# Patient Record
Sex: Female | Born: 1980 | Hispanic: Yes | Marital: Single | Smoking: Never smoker
Health system: Southern US, Community
[De-identification: ages and names within clinical notes are randomized; demographics above are authoritative.]

## PROBLEM LIST (undated history)

## (undated) DIAGNOSIS — E7800 Pure hypercholesterolemia, unspecified: Secondary | ICD-10-CM

## (undated) HISTORY — PX: ABDOMINAL HYSTERECTOMY: SHX81

---

## 2013-01-13 ENCOUNTER — Emergency Department: Payer: Self-pay | Admitting: Emergency Medicine

## 2017-03-24 ENCOUNTER — Encounter: Payer: Self-pay | Admitting: Emergency Medicine

## 2017-03-24 ENCOUNTER — Emergency Department: Payer: Self-pay

## 2017-03-24 ENCOUNTER — Emergency Department
Admission: EM | Admit: 2017-03-24 | Discharge: 2017-03-24 | Disposition: A | Discharge status: Home / Self Care | Payer: Self-pay | Attending: Emergency Medicine | Admitting: Emergency Medicine

## 2017-03-24 DIAGNOSIS — R1031 Right lower quadrant pain: Secondary | ICD-10-CM | POA: Insufficient documentation

## 2017-03-24 DIAGNOSIS — R1011 Right upper quadrant pain: Secondary | ICD-10-CM | POA: Insufficient documentation

## 2017-03-24 DIAGNOSIS — E785 Hyperlipidemia, unspecified: Secondary | ICD-10-CM | POA: Insufficient documentation

## 2017-03-24 DIAGNOSIS — R102 Pelvic and perineal pain: Secondary | ICD-10-CM | POA: Insufficient documentation

## 2017-03-24 DIAGNOSIS — M5441 Lumbago with sciatica, right side: Secondary | ICD-10-CM | POA: Insufficient documentation

## 2017-03-24 HISTORY — DX: Pure hypercholesterolemia, unspecified: E78.00

## 2017-03-24 LAB — CBC WITH DIFFERENTIAL/PLATELET
BASOS ABS: 0.1 10*3/uL (ref 0–0.1)
BASOS PCT: 1 %
EOS ABS: 0.1 10*3/uL (ref 0–0.7)
Eosinophils Relative: 1 %
HEMATOCRIT: 42.4 % (ref 35.0–47.0)
HEMOGLOBIN: 14.2 g/dL (ref 12.0–16.0)
Lymphocytes Relative: 31 %
Lymphs Abs: 2.9 10*3/uL (ref 1.0–3.6)
MCH: 28.2 pg (ref 26.0–34.0)
MCHC: 33.6 g/dL (ref 32.0–36.0)
MCV: 84.1 fL (ref 80.0–100.0)
MONOS PCT: 5 %
Monocytes Absolute: 0.5 10*3/uL (ref 0.2–0.9)
NEUTROS ABS: 5.7 10*3/uL (ref 1.4–6.5)
NEUTROS PCT: 62 %
Platelets: 372 10*3/uL (ref 150–440)
RBC: 5.04 MIL/uL (ref 3.80–5.20)
RDW: 14.6 % — ABNORMAL HIGH (ref 11.5–14.5)
WBC: 9.2 10*3/uL (ref 3.6–11.0)

## 2017-03-24 LAB — COMPREHENSIVE METABOLIC PANEL
ALBUMIN: 4.5 g/dL (ref 3.5–5.0)
ALK PHOS: 90 U/L (ref 38–126)
ALT: 19 U/L (ref 14–54)
ANION GAP: 8 (ref 5–15)
AST: 19 U/L (ref 15–41)
BILIRUBIN TOTAL: 0.6 mg/dL (ref 0.3–1.2)
BUN: 13 mg/dL (ref 6–20)
CALCIUM: 9.9 mg/dL (ref 8.9–10.3)
CO2: 25 mmol/L (ref 22–32)
CREATININE: 0.57 mg/dL (ref 0.44–1.00)
Chloride: 104 mmol/L (ref 101–111)
GFR calc Af Amer: >60 mL/min (ref >60)
GFR calc non Af Amer: >60 mL/min (ref >60)
GLUCOSE: 121 mg/dL — AB (ref 65–99)
Potassium: 3.4 mmol/L — ABNORMAL LOW (ref 3.5–5.1)
SODIUM: 137 mmol/L (ref 135–145)
TOTAL PROTEIN: 8.5 g/dL — AB (ref 6.5–8.1)

## 2017-03-24 LAB — LIPASE, BLOOD: Lipase: 24 U/L (ref 11–51)

## 2017-03-24 LAB — POCT PREGNANCY, URINE: Preg Test, Ur: NEGATIVE

## 2017-03-24 MED ORDER — METHYLPREDNISOLONE 4 MG PO TBPK: ORAL_TABLET | ORAL | 0 refills | Status: AC

## 2017-03-24 MED ORDER — MORPHINE SULFATE (PF) 4 MG/ML IV SOLN
4.0000 mg | Freq: Once | INTRAVENOUS | Status: AC
  Administered 2017-03-24: 4 mg via INTRAVENOUS
  Filled 2017-03-24: qty 1

## 2017-03-24 MED ORDER — ONDANSETRON HCL 4 MG/2ML IJ SOLN
4.0000 mg | Freq: Once | INTRAMUSCULAR | Status: AC
  Administered 2017-03-24: 4 mg via INTRAVENOUS
  Filled 2017-03-24: qty 2

## 2017-03-24 MED ORDER — CYCLOBENZAPRINE HCL 10 MG PO TABS: 10.0000 mg | ORAL_TABLET | Freq: Three times a day (TID) | ORAL | 0 refills | Status: AC | PRN

## 2017-03-24 MED ORDER — TRAMADOL HCL 50 MG PO TABS: 50.0000 mg | ORAL_TABLET | Freq: Four times a day (QID) | ORAL | 0 refills | Status: AC | PRN

## 2017-03-24 NOTE — ED Triage Notes (Signed)
Pt to ed with c/o all over generalized abd pain intermittently over the last month.  Pt also reports right sided lower back pain that radiates into right leg and right hip x3 days.  Pt reports pain in abd is worse after eating and feels like her abd is "going to blow up". Pt denies vomiting, denies diarrhea. Pt skin warm and dry.  Denies use of OTC meds.

## 2017-03-24 NOTE — ED Provider Notes (Signed)
[facility] Emergency Department Provider Note   ____________________________________________   First MD Initiated Contact with Patient 03/24/17 1126     (approximate)  I have reviewed the triage vital signs and the nursing notes.   HISTORY  Chief Complaint Back Pain and Abdominal Pain  ENT  HPI Chelsea Nichols is a 36 y.o. female patient presents with 2 complaints. First complaint is generalized abdominal pain for the past month. Patient states she's discussed this with her PCP he did a limited exam but no images or lab tests. Patient stated her abdominal pain is worse after eating. She states she felt like her abdomen is going to "blow up". Patient also complaining of right lower abdominal/pelvic pain. Patient denies any vomiting or diarrhea but state nausea patient also complaining of back pain with radicular component to the right lower extremity. Patient denies any provocative incident for her pain. Patient denies any bladder or bowel dysfunction. Patient states she also  discussed back pain to her PCP was told to follow-up condition worsens. No palliative measures for both complaints. Patient rates the pain as a 7/10. Patient is currently on her menstrual cycle.   Past Medical History:  Diagnosis Date  . High cholesterol     There are no active problems to display for this patient.   History reviewed. No pertinent surgical history.  Prior to Admission medications   Medication Sig Start Date End Date Taking? Authorizing Provider  cyclobenzaprine (FLEXERIL) 10 MG tablet Take 1 tablet (10 mg total) by mouth 3 (three) times daily as needed. 03/24/17   Joni Reiningonald K Smith, PA-C  methylPREDNISolone (MEDROL DOSEPAK) 4 MG TBPK tablet Take Tapered dose as directed 03/24/17   Joni Reiningonald K Smith, PA-C  traMADol (ULTRAM) 50 MG tablet Take 1 tablet (50 mg total) by mouth every 6 (six) hours as needed for moderate pain. 03/24/17   Joni Reiningonald K Smith, PA-C     Allergies Patient has no known allergies.  History reviewed. No pertinent family history.  Social History Social History  Substance Use Topics  . Smoking status: Never Smoker  . Smokeless tobacco: Never Used  . Alcohol use No    Review of Systems Constitutional: No fever/chills Eyes: No visual changes. ENT: No sore throat. Cardiovascular: Denies chest pain. Respiratory: Denies shortness of breath. Gastrointestinal: Right upper and lower abdominal pain.  No nausea, no vomiting.  No diarrhea.  No constipation. Genitourinary: Negative for dysuria. Right pelvic pain Musculoskeletal: Positive for back pain. Skin: Negative for rash. Neurological: Negative for headaches, focal weakness or numbness. Endocrine:Hyperlipidemia ____________________________________________   PHYSICAL EXAM:  VITAL SIGNS: ED Triage Vitals  Enc Vitals Group     BP      Pulse      Resp      Temp      Temp src      SpO2      Weight      Height      Head Circumference      Peak Flow      Pain Score      Pain Loc      Pain Edu?      Excl. in GC?     Constitutional: Alert and oriented. Well appearing and in no acute distress. Eyes: Conjunctivae are normal. PERRL. EOMI. Head: Atraumatic. Nose: No congestion/rhinnorhea. Mouth/Throat: Mucous membranes are moist.  Oropharynx non-erythematous. Neck: No stridor. No cervical spine tenderness to palpation. Hematological/Lymphatic/Immunilogical: No cervical lymphadenopathy. Cardiovascular: Normal rate, regular rhythm. Grossly normal heart  sounds.  Good peripheral circulation. Respiratory: Normal respiratory effort.  No retractions. Lungs CTAB. Gastrointestinal: Soft and nontender. No distention. No abdominal bruits. No CVA tenderness. Musculoskeletal: No lower extremity tenderness nor edema.  No joint effusions. Neurologic:  Normal speech and language. No gross focal neurologic deficits are appreciated. No gait instability. Skin:  Skin is warm,  dry and intact. No rash noted. Psychiatric: Mood and affect are normal. Speech and behavior are normal.  ____________________________________________   LABS (all labs ordered are listed, but only abnormal results are displayed)  Labs Reviewed  CBC WITH DIFFERENTIAL/PLATELET - Abnormal; Notable for the following:       Result Value   RDW 14.6 (*)    All other components within normal limits  COMPREHENSIVE METABOLIC PANEL - Abnormal; Notable for the following:    Potassium 3.4 (*)    Glucose, Bld 121 (*)    Total Protein 8.5 (*)    All other components within normal limits  LIPASE, BLOOD  POCT PREGNANCY, URINE  POC URINE PREG, ED   ____________________________________________  EKG   ____________________________________________  RADIOLOGY   _Ultrasound of the abdomen revealed fatty liver. Ultrasound of the pelvic area reveals a uterine fibroid. Lumbar spine x-ray was unremarkable. ___________________________________________   PROCEDURES  Procedure(s) performed: None  Procedures  Critical Care performed: No  ____________________________________________   INITIAL IMPRESSION / ASSESSMENT AND PLAN / ED COURSE  Pertinent labs & imaging results that were available during my care of the patient were reviewed by me and considered in my medical decision making (see chart for details).  Abdominal pain and pelvic pain. Patient labs unremarkable. Patient patient arches sounds consistent with fatty liver and uterine fibroid. X-ray of the L-spine was unremarkable. Patient given discharge care instructions. Patient given prescription for tramadol, Flexeril, Medrol Dosepak. Patient advised to follow her PCP in [facility] for further evaluation and consultations. Patient returns to ER for condition worsens.      ____________________________________________   FINAL CLINICAL IMPRESSION(S) / ED DIAGNOSES  Final diagnoses:  Pelvic pain  Pelvic pain in female  Right  upper quadrant abdominal pain  Right lower quadrant abdominal pain  Acute bilateral low back pain with right-sided sciatica      NEW MEDICATIONS STARTED DURING THIS VISIT:  New Prescriptions   CYCLOBENZAPRINE (FLEXERIL) 10 MG TABLET    Take 1 tablet (10 mg total) by mouth 3 (three) times daily as needed.   METHYLPREDNISOLONE (MEDROL DOSEPAK) 4 MG TBPK TABLET    Take Tapered dose as directed   TRAMADOL (ULTRAM) 50 MG TABLET    Take 1 tablet (50 mg total) by mouth every 6 (six) hours as needed for moderate pain.     Note:  This document was prepared using Dragon voice recognition software and may include unintentional dictation errors.    Joni Reining, PA-C 03/24/17 1420    Emily Filbert, MD 03/24/17 [phone]

## 2017-03-24 NOTE — ED Notes (Signed)
Pt states abd pain is in right lower quad, groin area that radiates into mid abd.

## 2019-08-23 ENCOUNTER — Emergency Department: Payer: Self-pay

## 2019-08-23 ENCOUNTER — Other Ambulatory Visit: Payer: Self-pay

## 2019-08-23 ENCOUNTER — Emergency Department
Admission: EM | Admit: 2019-08-23 | Discharge: 2019-08-24 | Disposition: A | Discharge status: Home / Self Care | Payer: Self-pay | Attending: Emergency Medicine | Admitting: Emergency Medicine

## 2019-08-23 DIAGNOSIS — R102 Pelvic and perineal pain: Secondary | ICD-10-CM | POA: Insufficient documentation

## 2019-08-23 DIAGNOSIS — Z79899 Other long term (current) drug therapy: Secondary | ICD-10-CM | POA: Insufficient documentation

## 2019-08-23 DIAGNOSIS — N83209 Unspecified ovarian cyst, unspecified side: Secondary | ICD-10-CM | POA: Insufficient documentation

## 2019-08-23 LAB — COMPREHENSIVE METABOLIC PANEL
ALT: 21 U/L (ref 0–44)
AST: 21 U/L (ref 15–41)
Albumin: 4.6 g/dL (ref 3.5–5.0)
Alkaline Phosphatase: 81 U/L (ref 38–126)
Anion gap: 11 (ref 5–15)
BUN: 13 mg/dL (ref 6–20)
CO2: 19 mmol/L — ABNORMAL LOW (ref 22–32)
Calcium: 9.4 mg/dL (ref 8.9–10.3)
Chloride: 105 mmol/L (ref 98–111)
Creatinine, Ser: 0.54 mg/dL (ref 0.44–1.00)
GFR calc Af Amer: >60 mL/min (ref >60)
GFR calc non Af Amer: >60 mL/min (ref >60)
Glucose, Bld: 112 mg/dL — ABNORMAL HIGH (ref 70–99)
Potassium: 3.4 mmol/L — ABNORMAL LOW (ref 3.5–5.1)
Sodium: 135 mmol/L (ref 135–145)
Total Bilirubin: 0.6 mg/dL (ref 0.3–1.2)
Total Protein: 7.9 g/dL (ref 6.5–8.1)

## 2019-08-23 LAB — CBC
HCT: 40.3 % (ref 36.0–46.0)
Hemoglobin: 13.8 g/dL (ref 12.0–15.0)
MCH: 29.2 pg (ref 26.0–34.0)
MCHC: 34.2 g/dL (ref 30.0–36.0)
MCV: 85.4 fL (ref 80.0–100.0)
Platelets: 328 10*3/uL (ref 150–400)
RBC: 4.72 MIL/uL (ref 3.87–5.11)
RDW: 13.2 % (ref 11.5–15.5)
WBC: 14 10*3/uL — ABNORMAL HIGH (ref 4.0–10.5)
nRBC: 0 % (ref 0.0–0.2)

## 2019-08-23 LAB — LIPASE, BLOOD: Lipase: 28 U/L (ref 11–51)

## 2019-08-23 MED ORDER — SODIUM CHLORIDE 0.9% FLUSH: 3.0000 mL | Freq: Once | INTRAVENOUS | Status: DC

## 2019-08-23 MED ORDER — SODIUM CHLORIDE 0.9 % IV BOLUS
1000.0000 mL | Freq: Once | INTRAVENOUS | Status: AC
  Administered 2019-08-23: 1000 mL via INTRAVENOUS

## 2019-08-23 MED ORDER — HYDROMORPHONE HCL 1 MG/ML IJ SOLN
1.0000 mg | Freq: Once | INTRAMUSCULAR | Status: AC
  Administered 2019-08-23: 1 mg via INTRAVENOUS
  Filled 2019-08-23: qty 1

## 2019-08-23 MED ORDER — IOHEXOL 300 MG/ML  SOLN
100.0000 mL | Freq: Once | INTRAMUSCULAR | Status: AC | PRN
  Administered 2019-08-23: 100 mL via INTRAVENOUS

## 2019-08-23 MED ORDER — ONDANSETRON HCL 4 MG/2ML IJ SOLN
4.0000 mg | Freq: Once | INTRAMUSCULAR | Status: AC
  Administered 2019-08-23: 4 mg via INTRAVENOUS
  Filled 2019-08-23: qty 2

## 2019-08-23 NOTE — ED Provider Notes (Signed)
[facility] Emergency Department Provider Note ____________________________________________   First MD Initiated Contact with Patient 08/23/19 1924     (approximate)  I have reviewed the triage vital signs and the nursing notes.   HISTORY  Chief Complaint Abdominal Pain  History of present illness obtained via Spanish interpretation by the patient's family member, per the patient's request  HPI Chelsea Nichols is a 38 y.o. female with PMH as noted below who presents with left lower quadrant abdominal pain, acute onset since noon today, radiating to the lower back, and associated with nausea but no vomiting.  She does not remember when her last bowel movement was.  She denies any urinary symptoms.  She has no prior history of this pain.  Past Medical History:  Diagnosis Date  . High cholesterol     There are no active problems to display for this patient.   Past Surgical History:  Procedure Laterality Date  . ABDOMINAL HYSTERECTOMY    . CESAREAN SECTION      Prior to Admission medications   Medication Sig Start Date End Date Taking? Authorizing Provider  cyclobenzaprine (FLEXERIL) 10 MG tablet Take 1 tablet (10 mg total) by mouth 3 (three) times daily as needed. 03/24/17   Joni ReiningSmith, Ronald K, PA-C  methylPREDNISolone (MEDROL DOSEPAK) 4 MG TBPK tablet Take Tapered dose as directed 03/24/17   Joni ReiningSmith, Ronald K, PA-C  traMADol (ULTRAM) 50 MG tablet Take 1 tablet (50 mg total) by mouth every 6 (six) hours as needed for moderate pain. 03/24/17   Joni ReiningSmith, Ronald K, PA-C    Allergies Patient has no known allergies.  No family history on file.  Social History Social History   Tobacco Use  . Smoking status: Never Smoker  . Smokeless tobacco: Never Used  Substance Use Topics  . Alcohol use: No  . Drug use: No    Review of Systems  Constitutional: No fever. Eyes: No redness. ENT: No sore throat. Cardiovascular: Denies chest pain. Respiratory:  Denies shortness of breath. Gastrointestinal: No vomiting. Genitourinary: Negative for dysuria.  Musculoskeletal: Positive for back pain. Skin: Negative for rash. Neurological: Negative for headache.   ____________________________________________   PHYSICAL EXAM:  VITAL SIGNS: ED Triage Vitals  Enc Vitals Group     BP 08/23/19 1857 (!) 161/73     Pulse Rate 08/23/19 1857 (!) 101     Resp 08/23/19 1857 18     Temp 08/23/19 1857 98.2 F (36.8 C)     Temp Source 08/23/19 1857 Oral     SpO2 08/23/19 1857 100 %     Weight 08/23/19 1858 203 lb (92.1 kg)     Height 08/23/19 1858 5\' 4"  (1.626 m)     Head Circumference --      Peak Flow --      Pain Score 08/23/19 1858 10     Pain Loc --      Pain Edu? --      Excl. in GC? --     Constitutional: Alert and oriented.  Uncomfortable appearing but in no acute distress. Eyes: Conjunctivae are normal.  No scleral icterus. Head: Atraumatic. Nose: No congestion/rhinnorhea. Mouth/Throat: Mucous membranes are moist.   Neck: Normal range of motion.  Cardiovascular: Good peripheral circulation. Respiratory: Normal respiratory effort.  No retractions. Gastrointestinal: Soft with moderate left lower quadrant tenderness. Genitourinary: No CVA tenderness. Musculoskeletal: Extremities warm and well perfused.  Neurologic:  Normal speech and language. No gross focal neurologic deficits are appreciated.  Skin:  Skin is warm and dry. No rash noted. Psychiatric: Mood and affect are normal. Speech and behavior are normal.  ____________________________________________   LABS (all labs ordered are listed, but only abnormal results are displayed)  Labs Reviewed  COMPREHENSIVE METABOLIC PANEL - Abnormal; Notable for the following components:      Result Value   Potassium 3.4 (*)    CO2 19 (*)    Glucose, Bld 112 (*)    All other components within normal limits  CBC - Abnormal; Notable for the following components:   WBC 14.0 (*)    All  other components within normal limits  LIPASE, BLOOD  URINALYSIS, COMPLETE (UACMP) WITH MICROSCOPIC   ____________________________________________  EKG   ____________________________________________  RADIOLOGY  CT abdomen: Enlarged left ovary with multiple follicles or cysts.  No other acute abnormality US pelvis: Pending  ____________________________________________   PROCEDURES  Procedure(s) performed: No  Procedures  Critical Care performed: No ____________________________________________   INITIAL IMPRESSION / ASSESSMENT AND PLAN / ED COURSE  Pertinent labs & imaging results that were available during my care of the patient were reviewed by me and considered in my medical decision making (see chart for details).  38 year old female with PMH of high cholesterol presents with acute onset of left lower quadrant abdominal pain around noon today and persistent since then.  She has had no vomiting or diarrhea, and no significant urinary symptoms.  There is no prior history of this pain.  On exam, the patient is quite uncomfortable appearing.  She is borderline tachycardic and slightly hypertensive, both likely due to her acute pain.  She is afebrile.  The abdomen is soft with mild distention and moderate left lower quadrant tenderness.  Differential includes colitis, diverticulitis, ureteral stone, pyelonephritis, ruptured ovarian cyst.  I have a low suspicion for torsion given the relatively superior location of the pain.  We will obtain lab work-up, urinalysis, CT abdomen, and reassess.  ----------------------------------------- 11:20 PM on 08/23/2019 -----------------------------------------  CT shows an enlarged left ovary with multiple follicles or cysts.  I ordered a pelvic ultrasound including Doppler to further evaluate.  The patient is feeling significantly better after pain medication.  Her urinalysis is also pending.  I signed the patient out to the oncoming  physician Dr. Beather Arbour.  ____________________________________________   FINAL CLINICAL IMPRESSION(S) / ED DIAGNOSES  Final diagnoses:  Ovarian cyst      NEW MEDICATIONS STARTED DURING THIS VISIT:  New Prescriptions   No medications on file     Note:  This document was prepared using Dragon voice recognition software and may include unintentional dictation errors.    Arta Silence, MD 08/23/19 2321

## 2019-08-23 NOTE — ED Triage Notes (Signed)
Pt c/o LLQ pain that radiates into the lower back since 12pm today, states she felt fine this morning. Denies vomiting.

## 2019-08-23 NOTE — ED Notes (Signed)
Pt transported to CT ?

## 2019-08-23 NOTE — ED Notes (Signed)
Pt in ultrasound

## 2019-08-23 NOTE — ED Notes (Signed)
MD at bedside. 

## 2019-08-24 LAB — URINALYSIS, COMPLETE (UACMP) WITH MICROSCOPIC
Bilirubin Urine: NEGATIVE
Glucose, UA: NEGATIVE mg/dL
Ketones, ur: NEGATIVE mg/dL
Leukocytes,Ua: NEGATIVE
Nitrite: NEGATIVE
Protein, ur: NEGATIVE mg/dL
Specific Gravity, Urine: 1.01 (ref 1.005–1.030)
pH: 6 (ref 5.0–8.0)

## 2019-08-24 MED ORDER — OXYCODONE-ACETAMINOPHEN 5-325 MG PO TABS
1.0000 | ORAL_TABLET | Freq: Once | ORAL | Status: AC
  Administered 2019-08-24: 1 via ORAL
  Filled 2019-08-24: qty 1

## 2019-08-24 MED ORDER — OXYCODONE-ACETAMINOPHEN 5-325 MG PO TABS: 1.0000 | ORAL_TABLET | ORAL | 0 refills | Status: AC | PRN

## 2019-08-24 NOTE — Discharge Instructions (Addendum)
1.  You may take Ibuprofen as needed for pain; Percocet as needed for more severe pain. 2.  Return to the ER for worsening symptoms, persistent vomiting, difficulty breathing or other concerns.

## 2019-08-24 NOTE — ED Provider Notes (Signed)
-----------------------------------------   12:08 AM on 08/24/2019 -----------------------------------------  Updated patient of ultrasound results demonstrating left ovarian cyst.  Awaiting urinalysis.   ----------------------------------------- 1:24 AM on 08/24/2019 -----------------------------------------  Urinalysis unremarkable.  Will discharge home with prescription for Percocet and GYN follow-up.  Strict return precautions given.  Patient verbalizes understanding agrees with plan of care.   Paulette Blanch, MD 08/24/19 [phone]

## 2020-10-11 IMAGING — CT CT ABDOMEN AND PELVIS WITH CONTRAST
2 of 4 series · 16 of 46 positions shown, 18 images · IV contrast (omnipaque)
Comparison: Pelvic ultrasound dated 03/24/2017

CLINICAL DATA: 37-year-old female with left lower quadrant
abdominal pain. History of hysterectomy.

EXAM:
CT ABDOMEN AND PELVIS WITH CONTRAST
TECHNIQUE: Multidetector CT imaging of the abdomen and pelvis was performed
using the standard protocol following bolus administration of
intravenous contrast.
CONTRAST:  100mL OMNIPAQUE IOHEXOL 300 MG/ML  SOLN

[Series 2: routine abd/pel with · axial · 0.70mm/px · z∈[-1028,-588]mm · 13 of 98 slices shown, 15 images]
[im 5/98  soft-tissue]
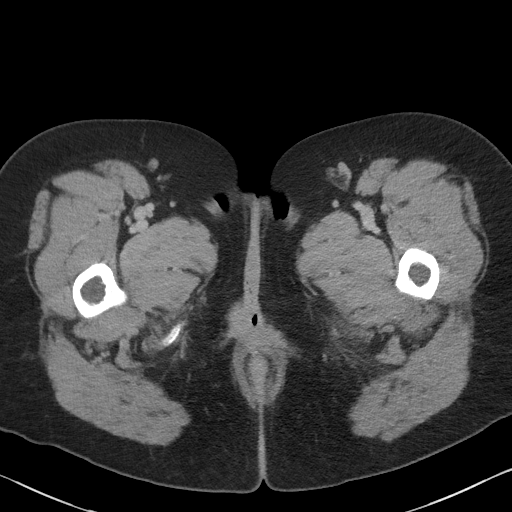
[im 5/98  bone]
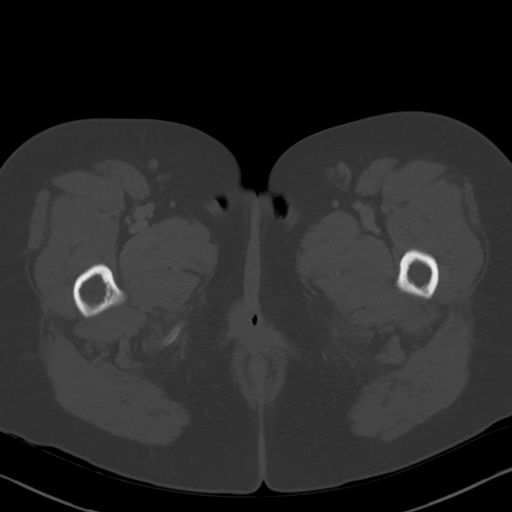
[im 13/98  soft-tissue]
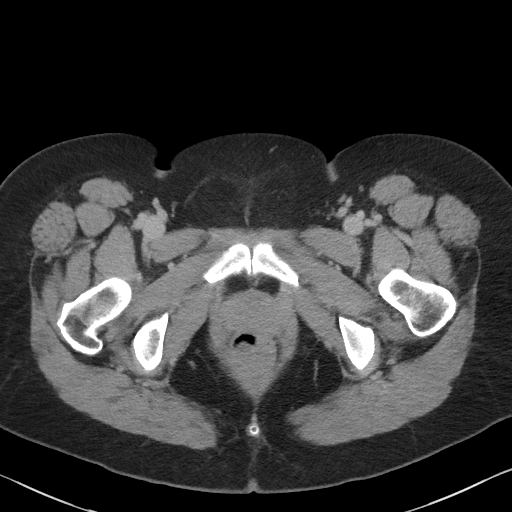
[im 22/98  soft-tissue]
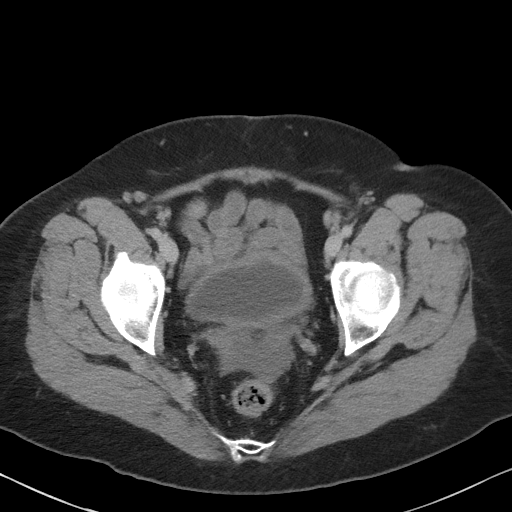
[im 26/98  soft-tissue]
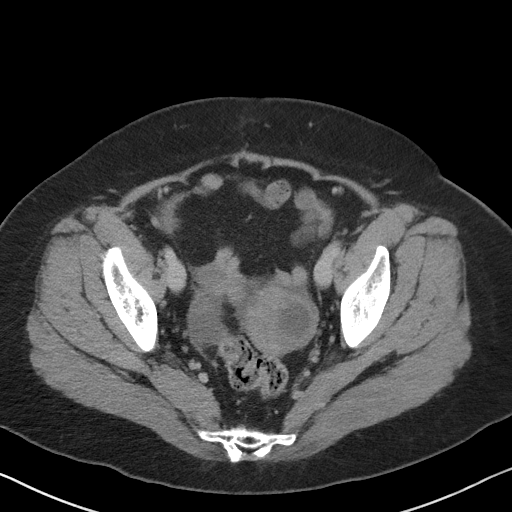
[im 34/98  soft-tissue]
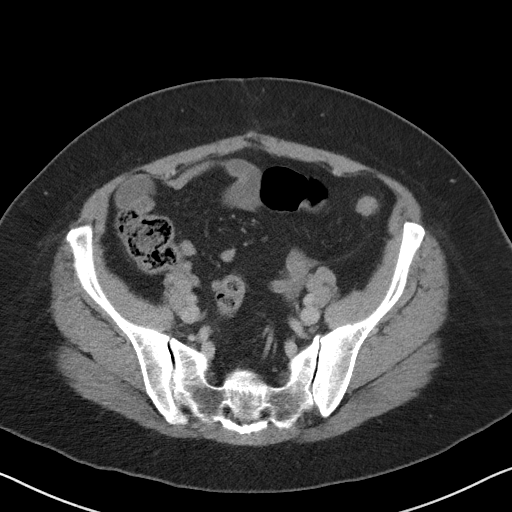
[im 43/98  soft-tissue]
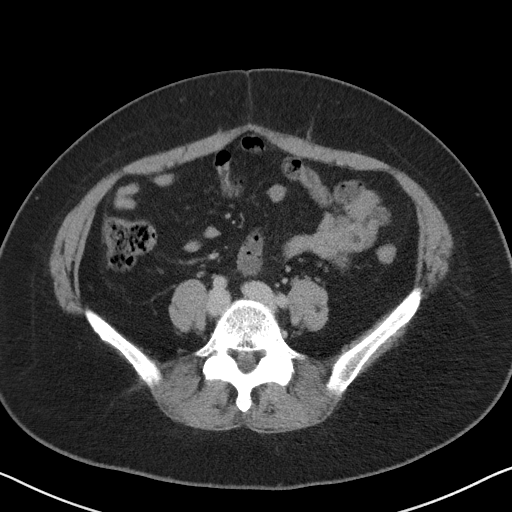
[im 51/98  soft-tissue]
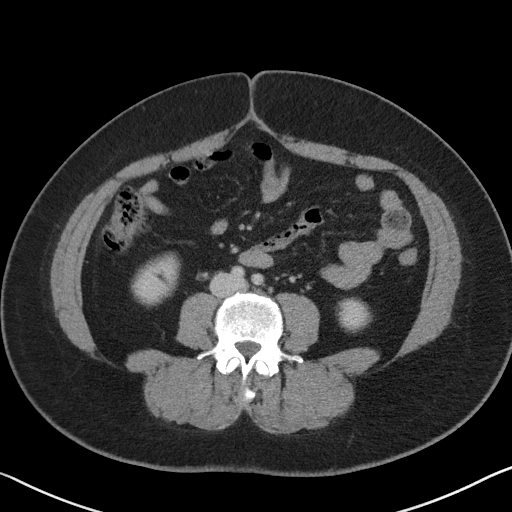
[im 55/98  soft-tissue]
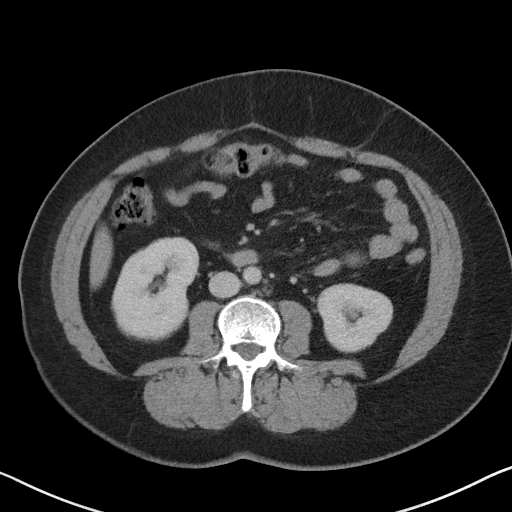
[im 64/98  soft-tissue]
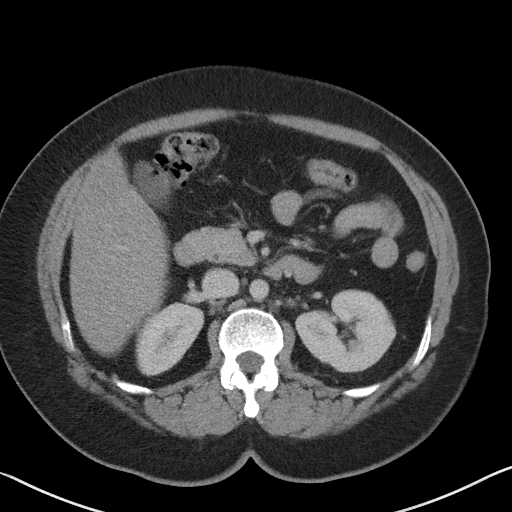
[im 64/98  bone]
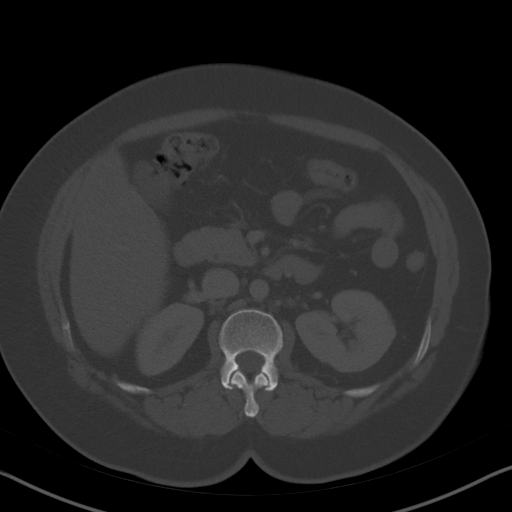
[im 72/98  soft-tissue]
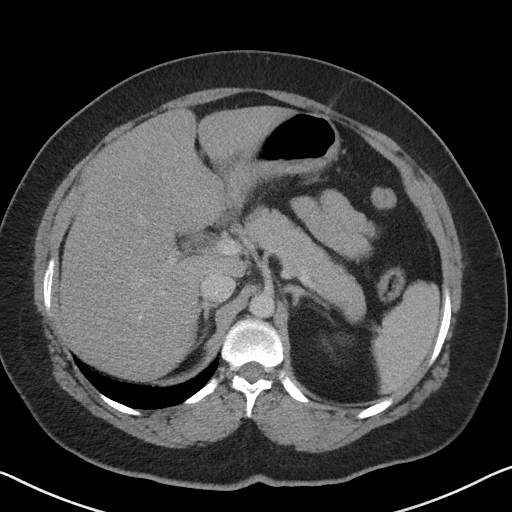
[im 76/98  soft-tissue]
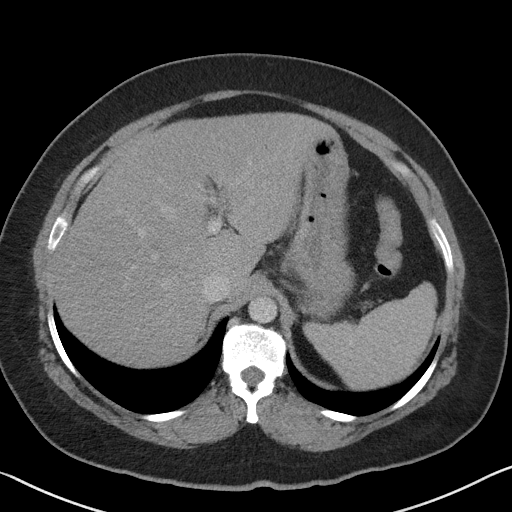
[im 85/98  soft-tissue]
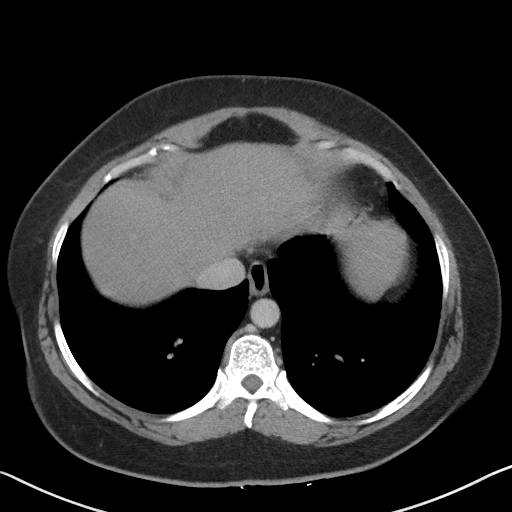
[im 93/98  soft-tissue]
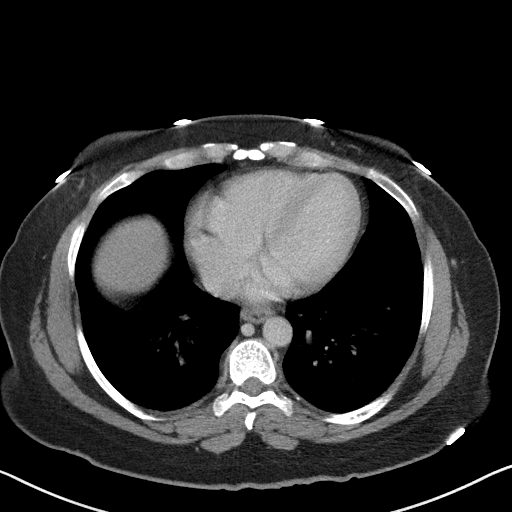

[Series 5: coronal st · coronal · 0.70mm/px · 3 of 99 slices shown]
[im 33/99  soft-tissue]
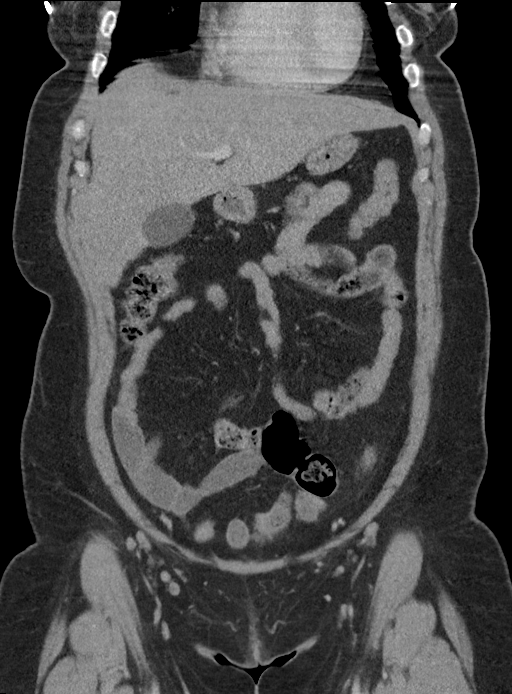
[im 44/99  soft-tissue]
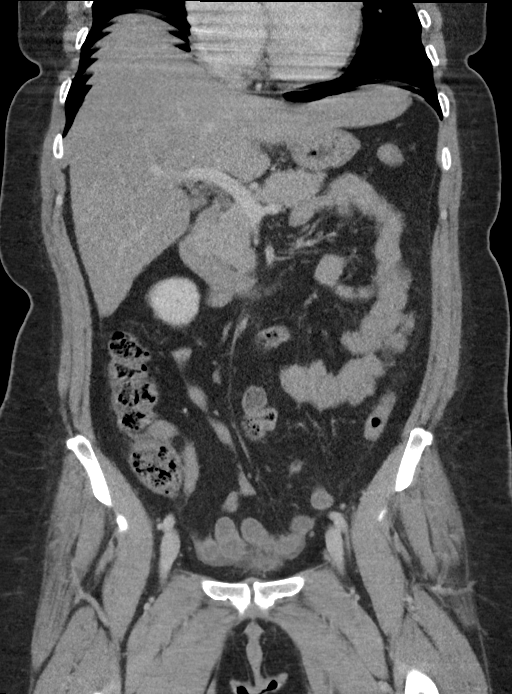
[im 55/99  soft-tissue]
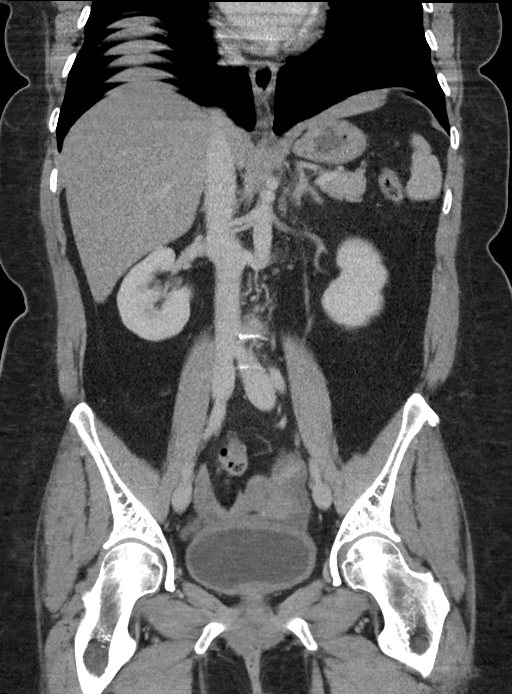

[16 of 46 positions shown; findings below may reference images not displayed]

FINDINGS: Lower chest: The visualized lung bases are clear.

No intra-abdominal free air. There is a small free fluid within the
pelvis.

Hepatobiliary: There is diffuse fatty infiltration the liver. No
intrahepatic biliary ductal dilatation. The gallbladder is
unremarkable.

Pancreas: Unremarkable. No pancreatic ductal dilatation or
surrounding inflammatory changes.

Spleen: Normal in size without focal abnormality.

Adrenals/Urinary Tract: The adrenal glands, kidneys, and the
visualized ureters appear unremarkable. There is apparent diffuse
thickening of the bladder wall which may be partly related to
underdistention. Cystitis is not excluded. Correlation with
urinalysis recommended.

Stomach/Bowel: There is no bowel obstruction or active inflammation.
The appendix is normal.

Vascular/Lymphatic: The abdominal aorta and IVC are unremarkable. No
portal venous gas. There is no adenopathy.

Reproductive: Hysterectomy. The left ovary is enlarged with multiple
follicles or cysts measuring up to 2.5 cm. The right ovary is
unremarkable. Further evaluation with pelvic ultrasound is
recommended.

Other: Midline vertical anterior pelvic wall incisional scar.

Musculoskeletal: No acute or significant osseous findings.
IMPRESSION: 1. Enlarged left ovary with multiple follicles or cysts. Further
evaluation with pelvic ultrasound recommended.
2. Fatty liver.
3. No bowel obstruction or active inflammation. Normal appendix.

## 2020-10-11 IMAGING — US US PELVIS COMPLETE TRANSABD/TRANSVAG W DUPLEX
1 series · 13 of 25 positions shown · non-contrast
Comparison: CT earlier today

CLINICAL DATA: Ovarian cyst seen on CT

EXAM:
TRANSABDOMINAL AND TRANSVAGINAL ULTRASOUND OF PELVIS
DOPPLER ULTRASOUND OF OVARIES
TECHNIQUE: Both transabdominal and transvaginal ultrasound examinations of the
pelvis were performed. Transabdominal technique was performed for
global imaging of the pelvis including uterus, ovaries, adnexal
regions, and pelvic cul-de-sac.
It was necessary to proceed with endovaginal exam following the
transabdominal exam to visualize the ovaries/adnexa. Color and
duplex Doppler ultrasound was utilized to evaluate blood flow to the
ovaries.

[Series 1: us pelvis complete transabd/transvag w duplex · 89 acquisitions, 13 frames shown]
[im 1/89]
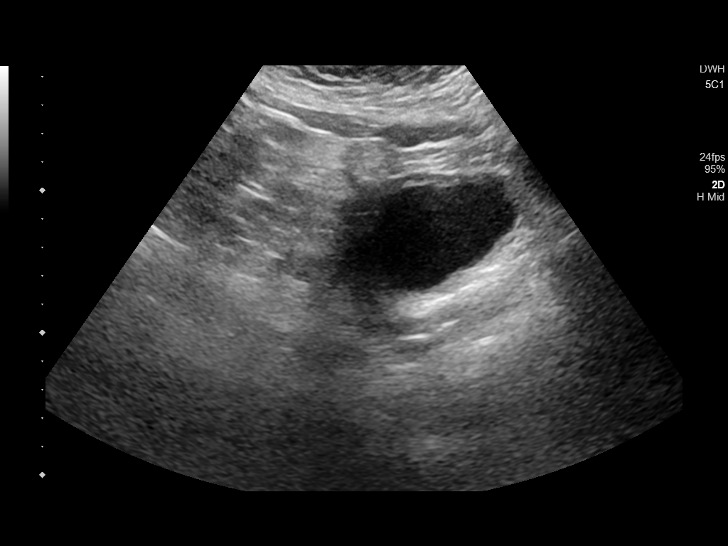
[im 8/89]
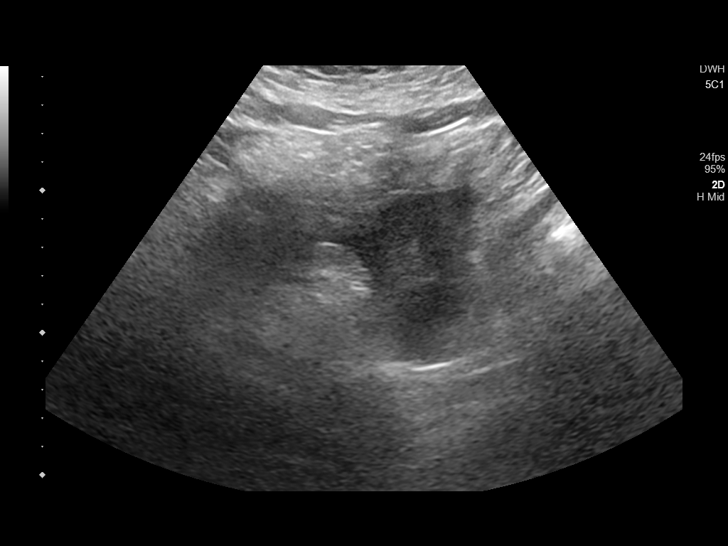
[im 15/89]
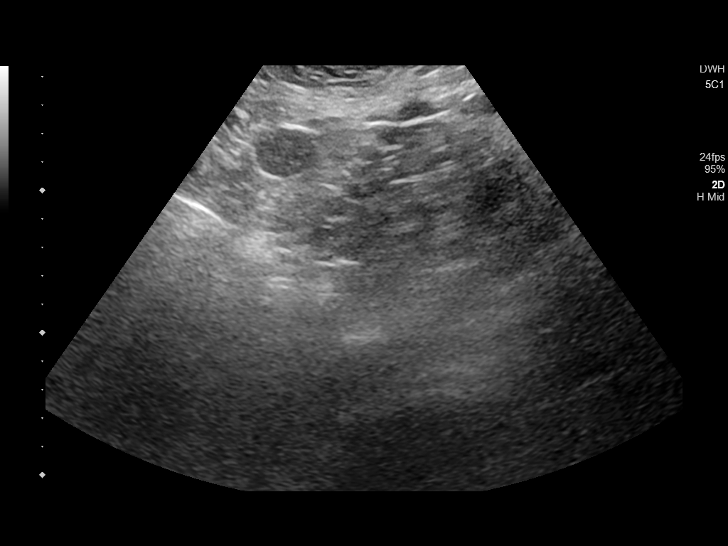
[im 23/89]
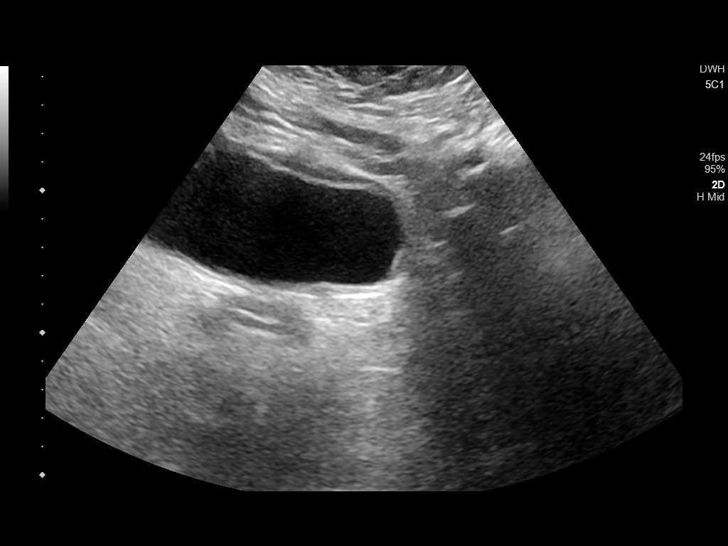
[im 30/89]
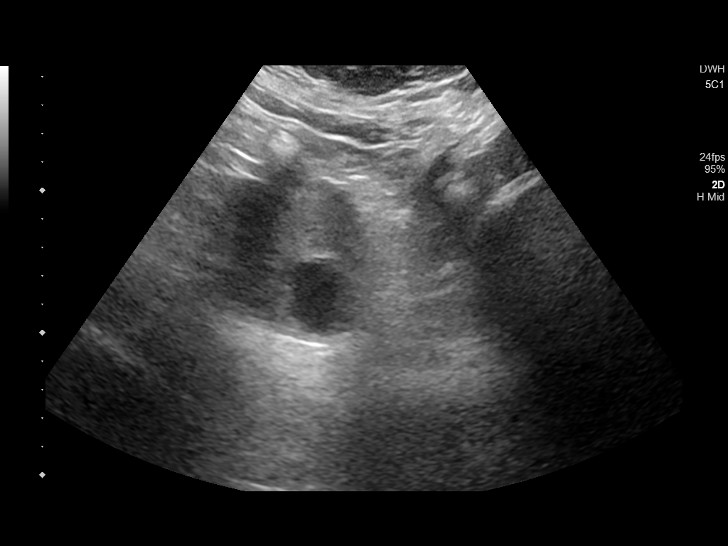
[im 37/89]
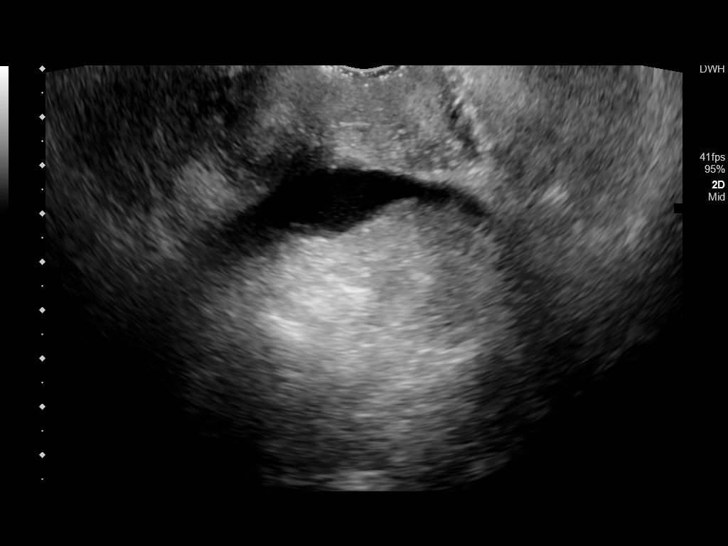
[im 45/89]
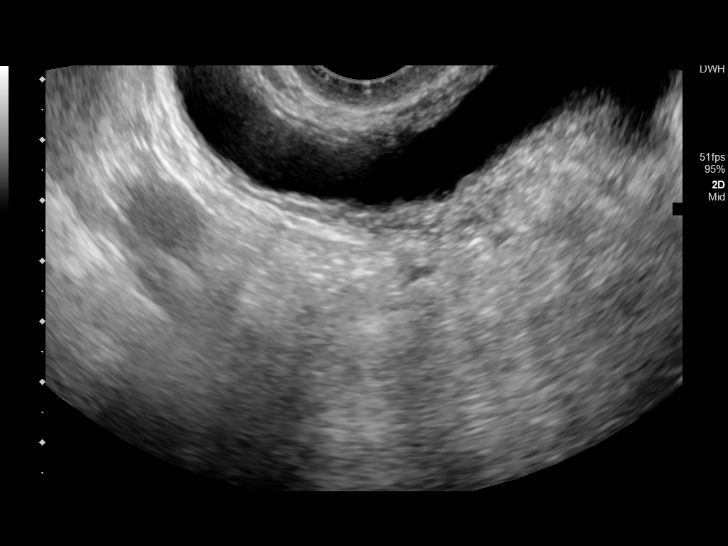
[im 52/89]
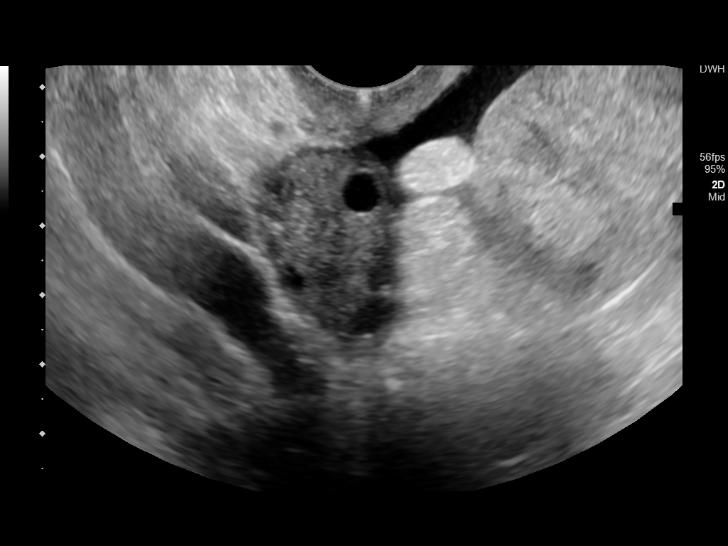
[im 59/89]
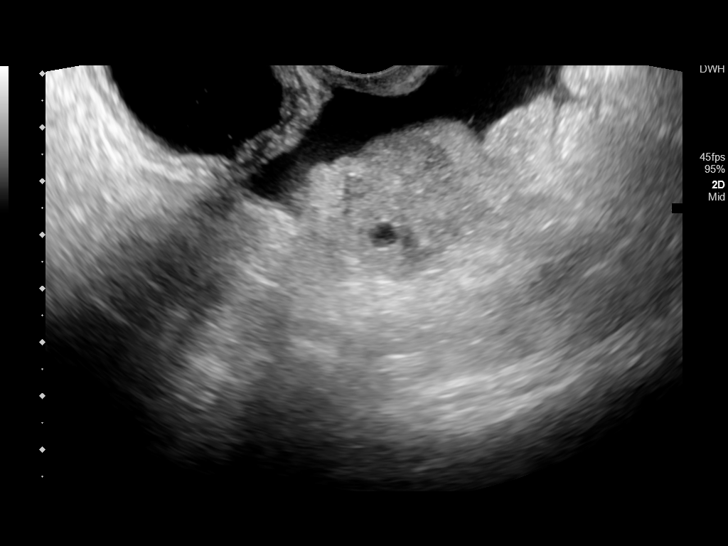
[im 67/89]
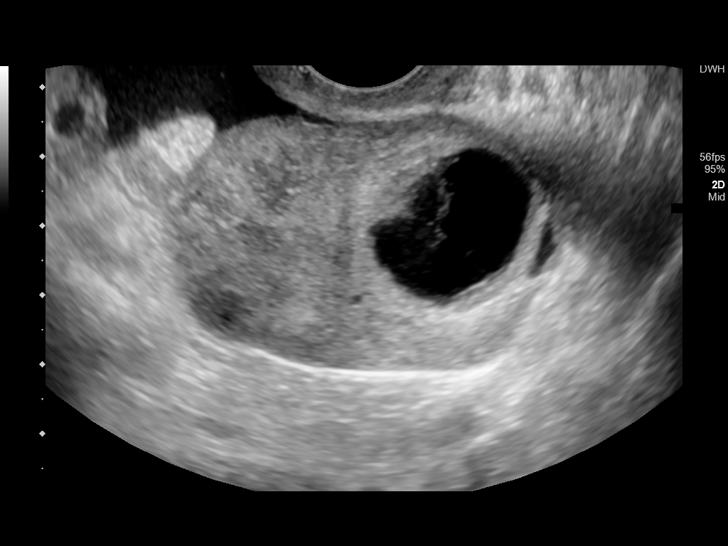
[im 74/89]
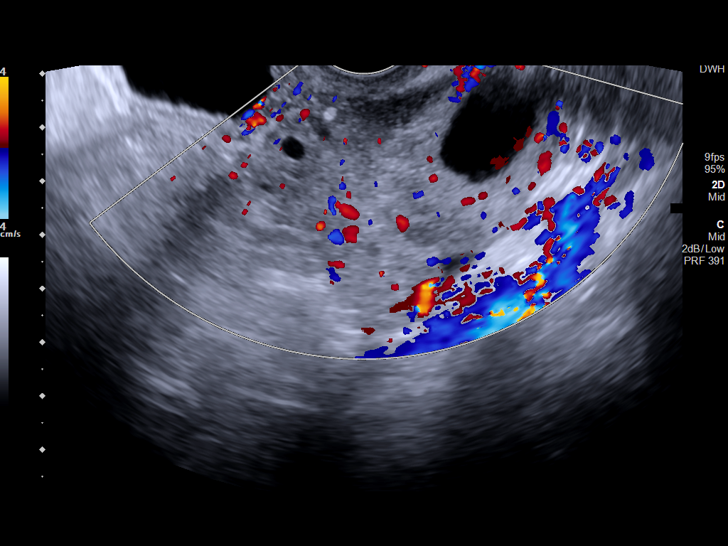
[im 81/89]
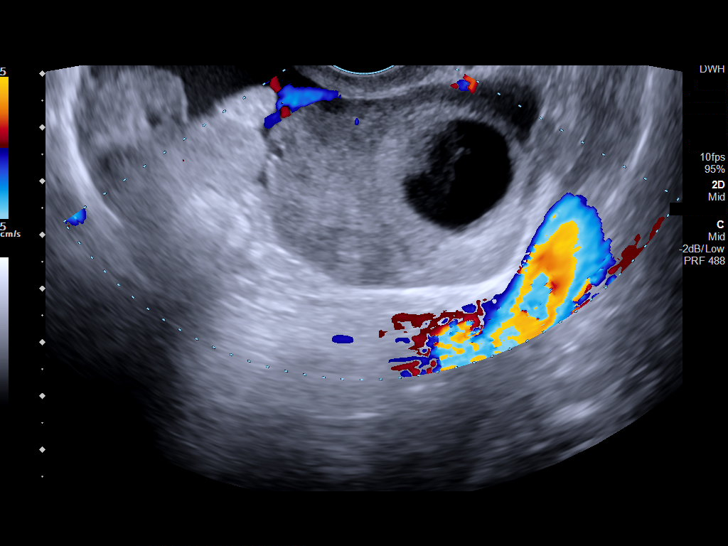
[im 89/89]
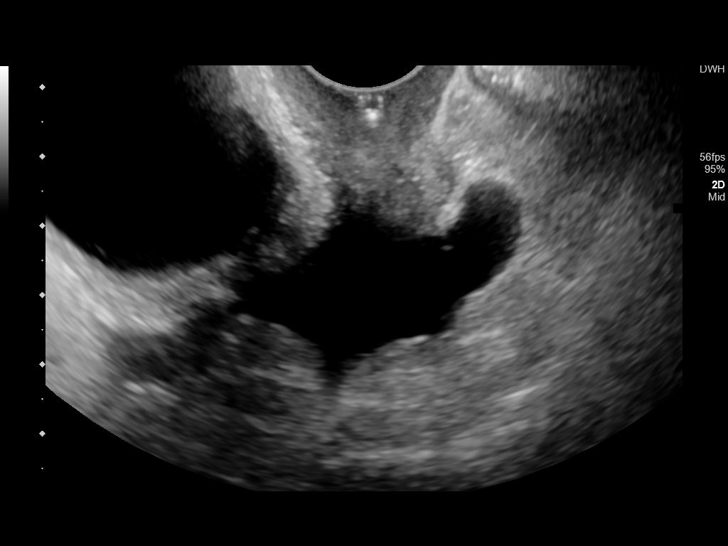

[13 of 25 positions shown; findings below may reference images not displayed]

FINDINGS: Uterus

Measurements: Prior hysterectomy.

Endometrium

Thickness: Prior hysterectomy.

Right ovary

Measurements: 3.2 x 2.1 x 2.1 cm = volume: 7.2 mL. Normal
appearance/no adnexal mass.

Left ovary

Measurements: 6.4 x 3.7 x 6.0 cm = volume: 74.3 mL. Left ovary
appears prominent with small follicles, also complex cyst measuring
3.1 x 2.9 x 2.8 cm. This likely reflects a small hemorrhagic cyst.

Pulsed Doppler evaluation of both ovaries demonstrates normal
low-resistance arterial and venous waveforms.

Other findings

Small amount of free fluid in the pelvis.
IMPRESSION: Left ovary is prominent with small follicles and small hemorrhagic
cyst which measures maximally 3.1 cm. No evidence of torsion.

Prior hysterectomy.

## 2021-06-15 LAB — GLUCOSE, POCT (MANUAL RESULT ENTRY): POC Glucose: 154 mg/dL — AB (ref 70–99)
